# Patient Record
Sex: Female | Born: 2000 | Race: White | Hispanic: No | Marital: Single | Smoking: Never smoker
Health system: Southern US, Community
[De-identification: ages and names within clinical notes are randomized; demographics above are authoritative.]

## PROBLEM LIST (undated history)

## (undated) DIAGNOSIS — S6290XA Unspecified fracture of unspecified wrist and hand, initial encounter for closed fracture: Secondary | ICD-10-CM

## (undated) DIAGNOSIS — A770 Spotted fever due to Rickettsia rickettsii: Secondary | ICD-10-CM

## (undated) DIAGNOSIS — M5126 Other intervertebral disc displacement, lumbar region: Secondary | ICD-10-CM

## (undated) DIAGNOSIS — E119 Type 2 diabetes mellitus without complications: Secondary | ICD-10-CM

## (undated) HISTORY — DX: Other intervertebral disc displacement, lumbar region: M51.26

## (undated) HISTORY — DX: Unspecified fracture of unspecified wrist and hand, initial encounter for closed fracture: S62.90XA

## (undated) HISTORY — DX: Type 2 diabetes mellitus without complications: E11.9

## (undated) HISTORY — PX: TONSILLECTOMY: SUR1361

---
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
## 2021-04-14 ENCOUNTER — Encounter (HOSPITAL_COMMUNITY): Payer: Self-pay | Admitting: *Deleted

## 2021-04-14 ENCOUNTER — Other Ambulatory Visit: Payer: Self-pay

## 2021-04-14 ENCOUNTER — Emergency Department (HOSPITAL_COMMUNITY): Payer: BC Managed Care – PPO

## 2021-04-14 ENCOUNTER — Emergency Department (HOSPITAL_COMMUNITY)
Admission: EM | Admit: 2021-04-14 | Discharge: 2021-04-14 | Disposition: A | Discharge status: Home / Self Care | Payer: BC Managed Care – PPO | Attending: Emergency Medicine | Admitting: Emergency Medicine

## 2021-04-14 DIAGNOSIS — M25511 Pain in right shoulder: Secondary | ICD-10-CM | POA: Insufficient documentation

## 2021-04-14 DIAGNOSIS — S0083XA Contusion of other part of head, initial encounter: Secondary | ICD-10-CM | POA: Diagnosis not present

## 2021-04-14 DIAGNOSIS — S60212A Contusion of left wrist, initial encounter: Secondary | ICD-10-CM | POA: Insufficient documentation

## 2021-04-14 DIAGNOSIS — S4992XA Unspecified injury of left shoulder and upper arm, initial encounter: Secondary | ICD-10-CM | POA: Insufficient documentation

## 2021-04-14 DIAGNOSIS — S6992XA Unspecified injury of left wrist, hand and finger(s), initial encounter: Secondary | ICD-10-CM | POA: Diagnosis present

## 2021-04-14 DIAGNOSIS — T07XXXA Unspecified multiple injuries, initial encounter: Secondary | ICD-10-CM

## 2021-04-14 DIAGNOSIS — Y92410 Unspecified street and highway as the place of occurrence of the external cause: Secondary | ICD-10-CM | POA: Diagnosis not present

## 2021-04-14 HISTORY — DX: Spotted fever due to Rickettsia rickettsii: A77.0
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
## 2021-04-14 NOTE — Discharge Instructions (Addendum)
There is no indication that you have a fracture.  Use ibuprofen, 3 times a day as needed for pain.  Use ice on sore areas 3 times a day, for 2 days, after that use heat.

## 2021-10-01 ENCOUNTER — Ambulatory Visit (INDEPENDENT_AMBULATORY_CARE_PROVIDER_SITE_OTHER): Payer: BC Managed Care – PPO

## 2021-10-01 ENCOUNTER — Other Ambulatory Visit: Payer: Self-pay

## 2021-10-01 ENCOUNTER — Ambulatory Visit
Admission: EM | Admit: 2021-10-01 | Discharge: 2021-10-01 | Disposition: A | Discharge status: Home / Self Care | Payer: BC Managed Care – PPO | Attending: Internal Medicine | Admitting: Internal Medicine

## 2021-10-01 DIAGNOSIS — B9689 Other specified bacterial agents as the cause of diseases classified elsewhere: Secondary | ICD-10-CM

## 2021-10-01 DIAGNOSIS — R059 Cough, unspecified: Secondary | ICD-10-CM

## 2021-10-01 DIAGNOSIS — H65193 Other acute nonsuppurative otitis media, bilateral: Secondary | ICD-10-CM

## 2021-10-01 DIAGNOSIS — J069 Acute upper respiratory infection, unspecified: Secondary | ICD-10-CM

## 2021-10-01 DIAGNOSIS — H109 Unspecified conjunctivitis: Secondary | ICD-10-CM

## 2021-10-01 MED ORDER — BENZONATATE 100 MG PO CAPS: 100.0000 mg | ORAL_CAPSULE | Freq: Three times a day (TID) | ORAL | 0 refills | Status: DC | PRN

## 2021-10-01 MED ORDER — ERYTHROMYCIN 5 MG/GM OP OINT: TOPICAL_OINTMENT | OPHTHALMIC | 0 refills | Status: DC

## 2021-10-01 MED ORDER — AMOXICILLIN-POT CLAVULANATE 875-125 MG PO TABS: 1.0000 | ORAL_TABLET | Freq: Two times a day (BID) | ORAL | 0 refills | Status: DC

## 2021-10-01 NOTE — ED Triage Notes (Signed)
Patient presents to Urgent Care with complaints of bilateral eye drainage, bilateral ear pressure since 12/24. Cough x 2 weeks. Treating symptoms with mucinex, Nyquil, and robitussin with no improvement. Concerned with sinus infection. 3 negative covid tests. Has a hx of ear infections and sinus infections.    Denies fever.

## 2021-10-01 NOTE — ED Provider Notes (Signed)
[facility]    CSN: [id number] Arrival date & time: 10/01/21  2202      History   Chief Complaint Chief Complaint  Patient presents with   Otalgia   Itchy Eye   Sinus Problem    HPI Brittney Marsh is a 20 y.o. female.   Patient presents today for further evaluation of cough, bilateral eye drainage and irritation, bilateral ear pain, nasal congestion and runny nose.  Patient reports that productive cough with green sputum started approximately 2 weeks ago.  She developed upper respiratory symptoms and eye symptoms a few days ago.  Denies any known fevers or sick contacts at home.  Patient reports that she has eye redness and that she has woken up with her eyes "crusted shut".  Denies any blurry vision.  Patient does not wear contacts.  Denies any traumas or foreign bodies to the eye.  Patient has taken Mucinex, NyQuil, Robitussin with no improvement in symptoms.  Denies chest pain or shortness of breath.   Otalgia Sinus Problem   Past Medical History:  Diagnosis Date   Fracture of hand 10/06/2009   RIght 2nd MCP: Resolved   [place] spotted fever     Patient Active Problem List   Diagnosis Date Noted   Acute upper respiratory infection 11/25/2016   Cough 11/25/2016    Past Surgical History:  Procedure Laterality Date   TONSILLECTOMY     Kindergarten    OB History   No obstetric history on file.      Home Medications    Prior to Admission medications   Medication Sig Start Date End Date Taking? Authorizing Provider  amoxicillin-clavulanate (AUGMENTIN) 875-125 MG tablet Take 1 tablet by mouth every 12 (twelve) hours. 10/01/21  Yes Makaylin Carlo, Rolly Salter E, FNP  benzonatate (TESSALON) 100 MG capsule Take 1 capsule (100 mg total) by mouth every 8 (eight) hours as needed for cough. 10/01/21  Yes Deonta Bomberger, Rolly Salter E, FNP  erythromycin ophthalmic ointment Place a 1/2 inch ribbon of ointment into the lower eyelid 4 times daily for 7 days. 10/01/21  Yes Vanessa Alesi,  Rolly Salter E, FNP  acetaminophen-codeine (TYLENOL #3) 300-30 MG tablet Take 1-2 tablets by mouth at bedtime as needed. Patient not taking: Reported on 01/31/2016 10/10/15   Briscoe Deutscher, DO    Family History Family History  Problem Relation Age of Onset   Diabetes Father    Heart disease Paternal Grandfather     Social History Social History   Tobacco Use   Smoking status: Never   Smokeless tobacco: Never  Vaping Use   Vaping Use: Never used  Substance Use Topics   Alcohol use: Yes    Comment: occasional   Drug use: No     Allergies   Bee venom   Review of Systems Review of Systems Per HPI  Physical Exam Triage Vital Signs ED Triage Vitals  Enc Vitals Group     BP 10/01/21 1010 (!) 138/91     Pulse Rate 10/01/21 1010 100     Resp 10/01/21 1010 18     Temp 10/01/21 1010 97.7 F (36.5 C)     Temp Source 10/01/21 1010 Oral     SpO2 10/01/21 1010 98 %     Weight --      Height --      Head Circumference --      Peak Flow --      Pain Score 10/01/21 1013 6     Pain Loc --  Pain Edu? --      Excl. in GC? --    No data found.  Updated Vital Signs BP (!) 138/91 (BP Location: Left Arm)    Pulse 100    Temp 97.7 F (36.5 C) (Oral)    Resp 18    LMP 09/23/2021    SpO2 98%   Visual Acuity Right Eye Distance: 20/20 Left Eye Distance: 20/20 Bilateral Distance: 20/20  Right Eye Near:   Left Eye Near:    Bilateral Near:     Physical Exam Constitutional:      General: She is not in acute distress.    Appearance: Normal appearance. She is not toxic-appearing or diaphoretic.  HENT:     Head: Normocephalic and atraumatic.     Right Ear: Ear canal normal. No swelling or tenderness. No middle ear effusion. No mastoid tenderness. Tympanic membrane is erythematous. Tympanic membrane is not perforated or bulging.     Left Ear: Ear canal normal. No swelling or tenderness.  No middle ear effusion. No mastoid tenderness. Tympanic membrane is erythematous. Tympanic  membrane is not perforated or bulging.     Nose: Congestion and rhinorrhea present. Rhinorrhea is purulent.     Mouth/Throat:     Mouth: Mucous membranes are moist.     Pharynx: No posterior oropharyngeal erythema.  Eyes:     General: Lids are normal. Lids are everted, no foreign bodies appreciated. Vision grossly intact. Gaze aligned appropriately.     Extraocular Movements: Extraocular movements intact.     Conjunctiva/sclera:     Right eye: Right conjunctiva is injected. No chemosis, exudate or hemorrhage.    Left eye: Left conjunctiva is injected. No chemosis, exudate or hemorrhage.    Pupils: Pupils are equal, round, and reactive to light.  Cardiovascular:     Rate and Rhythm: Normal rate and regular rhythm.     Pulses: Normal pulses.     Heart sounds: Normal heart sounds.  Pulmonary:     Effort: Pulmonary effort is normal. No respiratory distress.     Breath sounds: Normal breath sounds. No wheezing.  Abdominal:     General: Abdomen is flat. Bowel sounds are normal.     Palpations: Abdomen is soft.  Musculoskeletal:        General: Normal range of motion.     Cervical back: Normal range of motion.  Skin:    General: Skin is warm and dry.  Neurological:     General: No focal deficit present.     Mental Status: She is alert and oriented to person, place, and time. Mental status is at baseline.  Psychiatric:        Mood and Affect: Mood normal.        Behavior: Behavior normal.     UC Treatments / Results  Labs (all labs ordered are listed, but only abnormal results are displayed) Labs Reviewed - No data to display  EKG   Radiology DG Chest 2 View  Result Date: 10/01/2021 CLINICAL DATA:  Persistent cough EXAM: CHEST - 2 VIEW COMPARISON:  None. FINDINGS: Normal heart size. Normal mediastinal contour. No pneumothorax. No pleural effusion. Lungs appear clear, with no acute consolidative airspace disease and no pulmonary edema. IMPRESSION: No active cardiopulmonary  disease. Electronically Signed   By: Delbert Phenix M.D.   On: 10/01/2021 11:30    Procedures Procedures (including critical care time)  Medications Ordered in UC Medications - No data to display  Initial Impression / Assessment and Plan /  UC Course  I have reviewed the triage vital signs and the nursing notes.  Pertinent labs & imaging results that were available during my care of the patient were reviewed by me and considered in my medical decision making (see chart for details).     Will treat acute upper respiratory infection and bilateral otitis media with augmentin antibiotic.  Will treat bilateral bacterial conjunctivitis with erythromycin ointment.  Visual acuity normal today in urgent care.  Chest x-ray was negative for any acute cardiopulmonary process.  Benzonatate prescribed take as needed for cough.  No red flags on exam.  Discussed strict return cautions.  Patient verbalized understanding and was agreeable with plan. Final Clinical Impressions(s) / UC Diagnoses   Final diagnoses:  Bacterial conjunctivitis of both eyes  Other non-recurrent acute nonsuppurative otitis media of both ears  Acute upper respiratory infection     Discharge Instructions      You have been prescribed an antibiotic pill and an antibiotic ointment to treat your symptoms.  You have also been prescribed a cough medication.  Your chest x-ray was normal.     ED Prescriptions     Medication Sig Dispense Auth. Provider   erythromycin ophthalmic ointment Place a 1/2 inch ribbon of ointment into the lower eyelid 4 times daily for 7 days. 3.5 g Ervin Knack E, [place]   amoxicillin-clavulanate (AUGMENTIN) 875-125 MG tablet Take 1 tablet by mouth every 12 (twelve) hours. 14 tablet [place], Ophiem E, [place]   benzonatate (TESSALON) 100 MG capsule Take 1 capsule (100 mg total) by mouth every 8 (eight) hours as needed for cough. 21 capsule [place], Acie Fredrickson, [place]      PDMP not reviewed this encounter.   Gustavus Bryant, [place] 10/01/21 1157

## 2021-10-01 NOTE — Discharge Instructions (Signed)
You have been prescribed an antibiotic pill and an antibiotic ointment to treat your symptoms.  You have also been prescribed a cough medication.  Your chest x-ray was normal.

## 2022-05-15 ENCOUNTER — Other Ambulatory Visit (HOSPITAL_COMMUNITY)
Admission: RE | Admit: 2022-05-15 | Discharge: 2022-05-15 | Disposition: A | Discharge status: Home / Self Care | Payer: BC Managed Care – PPO | Source: Ambulatory Visit | Attending: Advanced Practice Midwife | Admitting: Advanced Practice Midwife

## 2022-05-15 ENCOUNTER — Ambulatory Visit (INDEPENDENT_AMBULATORY_CARE_PROVIDER_SITE_OTHER): Payer: BC Managed Care – PPO | Admitting: Advanced Practice Midwife

## 2022-05-15 ENCOUNTER — Encounter: Payer: Self-pay | Admitting: Advanced Practice Midwife

## 2022-05-15 VITALS — BP 134/86 | HR 93 | Ht 61.0 in | Wt 216.0 lb

## 2022-05-15 DIAGNOSIS — Z6841 Body Mass Index (BMI) 40.0 and over, adult: Secondary | ICD-10-CM

## 2022-05-15 DIAGNOSIS — Z01419 Encounter for gynecological examination (general) (routine) without abnormal findings: Secondary | ICD-10-CM | POA: Diagnosis not present

## 2022-05-15 DIAGNOSIS — E6609 Other obesity due to excess calories: Secondary | ICD-10-CM

## 2022-05-15 DIAGNOSIS — Z30011 Encounter for initial prescription of contraceptive pills: Secondary | ICD-10-CM | POA: Diagnosis not present

## 2022-05-15 LAB — CBC
Hematocrit: 42 % (ref 34.0–46.6)
Hemoglobin: 13.9 g/dL (ref 11.1–15.9)
MCH: 29.5 pg (ref 26.6–33.0)
MCHC: 33.1 g/dL (ref 31.5–35.7)
MCV: 89 fL (ref 79–97)
Platelets: 313 10*3/uL (ref 150–450)
RBC: 4.71 x10E6/uL (ref 3.77–5.28)
RDW: 12.7 % (ref 11.7–15.4)
WBC: 8.4 10*3/uL (ref 3.4–10.8)

## 2022-05-15 LAB — HEMOGLOBIN A1C
Est. average glucose Bld gHb Est-mCnc: 137 mg/dL
Hgb A1c MFr Bld: 6.4 % — ABNORMAL HIGH (ref 4.8–5.6)

## 2022-05-15 MED ORDER — SLYND 4 MG PO TABS: 1.0000 | ORAL_TABLET | Freq: Every day | ORAL | 3 refills | Status: AC

## 2022-05-15 NOTE — Progress Notes (Signed)
Would like to get back on OCP's

## 2022-05-15 NOTE — Progress Notes (Signed)
GYNECOLOGY ANNUAL PREVENTATIVE CARE ENCOUNTER NOTE  History:     Brittney Marsh is a 21 y.o. G0P0000 female here for a routine annual gynecologic exam.  Current complaints: patient would like to restart contraception, desires pregnancy in 12-24 months. Previous negative experience with Brittney Marsh which caused significant weight gain. Denies abnormal vaginal bleeding, discharge, pelvic pain, problems with intercourse or other gynecologic concerns.   Patient works as Pharmacist, hospital and travel Music therapist. Engaged! Honeymoon in [place]. They have two dogs, a poodle and a cockapoo.    Gynecologic History Patient's last menstrual period was 05/11/2022 (exact date). Contraception: condoms Last Pap: No pap history, first pap today :) Last Mammogram: N/A.   Last Colonoscopy: N/A.   Obstetric History OB History  Gravida Para Term Preterm AB Living  0 0 0 0 0 0  SAB IAB Ectopic Multiple Live Births  0 0 0 0 0    Past Medical History:  Diagnosis Date   Fracture of hand 10/06/2009   RIght 2nd MCP: Resolved   Rocky Brittney spotted fever     Past Surgical History:  Procedure Laterality Date   TONSILLECTOMY     Kindergarten    No current outpatient medications on file prior to visit.   No current facility-administered medications on file prior to visit.    Allergies  Allergen Reactions   Bee Venom Swelling    Whole body swelling    Social History:  reports that she has never smoked. She has never used smokeless tobacco. She reports current alcohol use. She reports that she does not use drugs.  Family History  Problem Relation Age of Onset   Diabetes Father    Heart disease Paternal Grandfather     The following portions of the patient's history were reviewed and updated as appropriate: allergies, current medications, past family history, past medical history, past social history, past surgical history and problem list.  Review of Systems Pertinent items noted in HPI and remainder of  comprehensive ROS otherwise negative.  Physical Exam:  BP 134/86   Pulse 93   Ht _0  (1.549 m)   Wt 216 lb (98 kg)   LMP 05/11/2022 (Exact Date)   BMI 40.81 kg/m  CONSTITUTIONAL: Well-developed, well-nourished female in no acute distress.  HENT:  Normocephalic, atraumatic, External right and left ear normal.  EYES: Conjunctivae and EOM are normal. Pupils are equal, round, and reactive to light. No scleral icterus.  NECK: Normal range of motion, supple, no masses.  Normal thyroid.  SKIN: Skin is warm and dry. No rash noted. Not diaphoretic. No erythema. No pallor. MUSCULOSKELETAL: Normal range of motion. No tenderness.  No cyanosis, clubbing, or edema. NEUROLOGIC: Alert and oriented to person, place, and time. Normal reflexes, muscle tone coordination.  PSYCHIATRIC: Normal mood and affect. Normal behavior. Normal judgment and thought content. CARDIOVASCULAR: Normal heart rate noted, regular rhythm RESPIRATORY: Clear to auscultation bilaterally. Effort and breath sounds normal, no problems with respiration noted. BREASTS: Symmetric in size. No masses, tenderness, skin changes, nipple drainage, or lymphadenopathy bilaterally. Performed in the presence of a chaperone. ABDOMEN: Soft, no distention noted.  No tenderness, rebound or guarding.  PELVIC: Normal appearing external genitalia and urethral meatus; normal appearing vaginal mucosa and cervix.  No abnormal vaginal discharge noted.  Pap smear obtained.  Performed in the presence of a chaperone.   Assessment and Plan:    1. Well woman exam with routine gynecological exam - Welcome to practice, thank you for your trust! - Did  so well with her first pap smear today! - Cytology - PAP  2. Obesity due to excess calories without serious comorbidity, unspecified classification - Doing awesome with physical exercise, Burn [facility] - Has PCP but hoping to find female provider in same practice - CBC - Comp Met (CMET) - Hemoglobin A1c -  Thyroid Panel With TSH - Lipid panel - Referral to Nutrition and Diabetes Services  3. Oral contraception initiation - Reviewed typical learning curve for POPs, Slynd most cost effective via mail order pharmacy - Continue condom use while relearning daily pill regimen  - Drospirenone (SLYND) 4 MG TABS; Take 1 each by mouth daily.  Dispense: 28 tablet; Refill: 3  Will follow up results of pap smear and manage accordingly. Routine preventative health maintenance measures emphasized. Please refer to After Visit Summary for other counseling recommendations.      RTC in 3 months to review patient satisfaction with Brittney Marsh, Brittney View, MSN, CNM Certified Nurse Midwife, Brittney Marsh for Brittney Marsh, Brittney Marsh

## 2022-05-16 LAB — COMPREHENSIVE METABOLIC PANEL
ALT: 24 IU/L (ref 0–32)
AST: 21 IU/L (ref 0–40)
Albumin/Globulin Ratio: 1.9 (ref 1.2–2.2)
Albumin: 4.3 g/dL (ref 4.0–5.0)
Alkaline Phosphatase: 74 IU/L (ref 44–121)
BUN/Creatinine Ratio: 21 (ref 9–23)
BUN: 15 mg/dL (ref 6–20)
Bilirubin Total: 0.6 mg/dL (ref 0.0–1.2)
CO2: 21 mmol/L (ref 20–29)
Calcium: 9.5 mg/dL (ref 8.7–10.2)
Chloride: 102 mmol/L (ref 96–106)
Creatinine, Ser: 0.7 mg/dL (ref 0.57–1.00)
Globulin, Total: 2.3 g/dL (ref 1.5–4.5)
Glucose: 121 mg/dL — ABNORMAL HIGH (ref 70–99)
Potassium: 4.1 mmol/L (ref 3.5–5.2)
Sodium: 139 mmol/L (ref 134–144)
Total Protein: 6.6 g/dL (ref 6.0–8.5)
eGFR: 125 mL/min/{1.73_m2} (ref >59)

## 2022-05-16 LAB — THYROID PANEL WITH TSH
Free Thyroxine Index: 2.3 (ref 1.2–4.9)
T3 Uptake Ratio: 25 % (ref 24–39)
T4, Total: 9.3 ug/dL (ref 4.5–12.0)
TSH: 2.89 u[IU]/mL (ref 0.450–4.500)

## 2022-05-16 LAB — LIPID PANEL
Chol/HDL Ratio: 5.3 ratio — ABNORMAL HIGH (ref 0.0–4.4)
Cholesterol, Total: 234 mg/dL — ABNORMAL HIGH (ref 100–199)
HDL: 44 mg/dL (ref >39)
LDL Chol Calc (NIH): 141 mg/dL — ABNORMAL HIGH (ref 0–99)
Triglycerides: 272 mg/dL — ABNORMAL HIGH (ref 0–149)
VLDL Cholesterol Cal: 49 mg/dL — ABNORMAL HIGH (ref 5–40)

## 2022-05-30 LAB — CYTOLOGY - PAP
Chlamydia: NEGATIVE
Comment: NEGATIVE
Comment: NORMAL
Diagnosis: NEGATIVE
Neisseria Gonorrhea: NEGATIVE

## 2022-07-16 ENCOUNTER — Encounter: Payer: BC Managed Care – PPO | Attending: Advanced Practice Midwife | Admitting: Dietician

## 2022-07-16 ENCOUNTER — Encounter: Payer: Self-pay | Admitting: Dietician

## 2022-07-16 DIAGNOSIS — Z6841 Body Mass Index (BMI) 40.0 and over, adult: Secondary | ICD-10-CM | POA: Diagnosis not present

## 2022-07-16 NOTE — Progress Notes (Signed)
Medical Nutrition Therapy: Visit start time: [id number]  end time: 1645  Assessment:   Referral Diagnosis: obesity Other medical history/ diagnoses: none significant Psychosocial issues/ stress concerns: none  Medications, supplements: reconciled list in medical record   Preferred learning method:  Auditory Visual Hands-on    Current weight: 216.9lbs Height: 5'1" BMI: 40.98 Patient's personal weight goal: 160lbs  Progress and evaluation:  Patient reports she lost about 50lbs on Optivia 2 years ago, but has regained all of the weight. She was buying Optivia from a close friend who then passed away.  Has tried multiple diets with only limited and short term success. Used MyFitnessPal for a time and was following healthy eating pattern but still experienced weight fluctuation  She reports both parents have diabetes, and she has had increase in DM related labs.  Recent HbA1C was 6.4%; total cholesterol 234, HDL 44, LDL 141, Triglycerides 272 (05/15/22) Food allergies: no allergies; reports lactose intolerance, but is able to have small amounts without issue Special diet practices: none Patient seeks help with sustainable weight loss and improved health risk    Dietary Intake:  Usual eating pattern includes 3 meals and 1-2 snacks per day. Dining out frequency: 5-7 meals per week. Who plans meals/ buys groceries? self Who prepares meals? 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, 2-3 times a week currently due to wedding planning; usually 3-5 itmes a week   Intervention:   Nutrition Care Education:    Basic nutrition: basic food groups; appropriate nutrient balance; appropriate meal and snack schedule; general nutrition guidelines    Weight control: importance of low sugar and low fat choices; portion control; estimated energy needs for weight loss at 1600 kcal, provided guidance for 45% CHO, 25% pro, 30% fat; role of stress; role of physical activity; tracking food intake -- uses and options Advanced nutrition:  cooking techniques -- meal planning and pre-prepping, planned leftovers; dining out Diabetes prevention:  goal for HbA1C; appropriate meal and snack schedule; appropriate carb intake and balance, healthy carb choices; role of fiber, protein, fat; physical activity; effects of stress Hyperlipidemia:  target goals for lipids; healthy and unhealthy fats; role of fiber, plant sterols; role of exercise  Other intervention notes: Patient has been working on healthy diet habits for some time; she is following a low carb, high protein pattern.  Upcoming wedding might make lifestyle changes more challenging for a time, encouraged patient to make gradual changes, and to focus on 1-2 changes at a time. Established nutrition goals with direction from patient She will plan to schedule MNT follow when able after wedding/ honeymoon.   Nutritional Diagnosis:  [place]-2.2 Altered nutrition-related laboratory As related to diabetes risk and heart health.  As evidenced by elevated HbA1c, elevated total and LDL cholesterol, elevated triglycerides. [place]-3.3 Overweight/obesity As related to excess calories, dieting history.  As evidenced by patient with current BMI of 40.98.   Education Materials given:  General diet guidelines for Diabetes (prevention) Plate Planner with food lists, sample meal pattern Sample menus Visit summary with goals/ instructions   Learner/ who was taught:  Patient   Level of understanding: Verbalizes/  demonstrates competency  Demonstrated degree of understanding via:   Teach  back Learning barriers: None  Willingness to learn/ readiness for change: Eager, change in progress  Monitoring and Evaluation:  Dietary intake, exercise, BG control, blood lipids, and body weight      follow up: patient to schedule later for Dec 2023 or Jan 2024

## 2022-07-16 NOTE — Patient Instructions (Signed)
Use MyFitnessPal or other tracking app to track "normal" food intake for several days, then add 100-150 calories, perhaps as 1-2 servings of healthy carbs, with 1-2 meals during the day.  Options for adding healthy carbs might be a fruit or whole grain bread/ breakfast bar with protein shake; 1/2 cup potatoes, or 1-2 slices whole grain bread, or 1/3 - 2/3 cup brown rice or pasta, 5-6 small whole grain/ seeded crackers or crispbread. Include generous portions of veggies, add some into rice/ pasta/ potatoes/ meats to stretch portions, and plan to have a veggie and/or fruit with most meals.

## 2022-08-10 ENCOUNTER — Encounter: Payer: Self-pay | Admitting: Advanced Practice Midwife

## 2022-08-11 ENCOUNTER — Other Ambulatory Visit: Payer: Self-pay | Admitting: *Deleted

## 2022-08-11 MED ORDER — SLYND 4 MG PO TABS: 4.0000 mg | ORAL_TABLET | Freq: Every day | ORAL | 9 refills | Status: DC

## 2022-09-03 IMAGING — DX DG CHEST 2V
2 series · 2 of 2 positions shown · non-contrast
Comparison: None.

CLINICAL DATA: Persistent cough

EXAM:
CHEST - 2 VIEW

[chest pa]
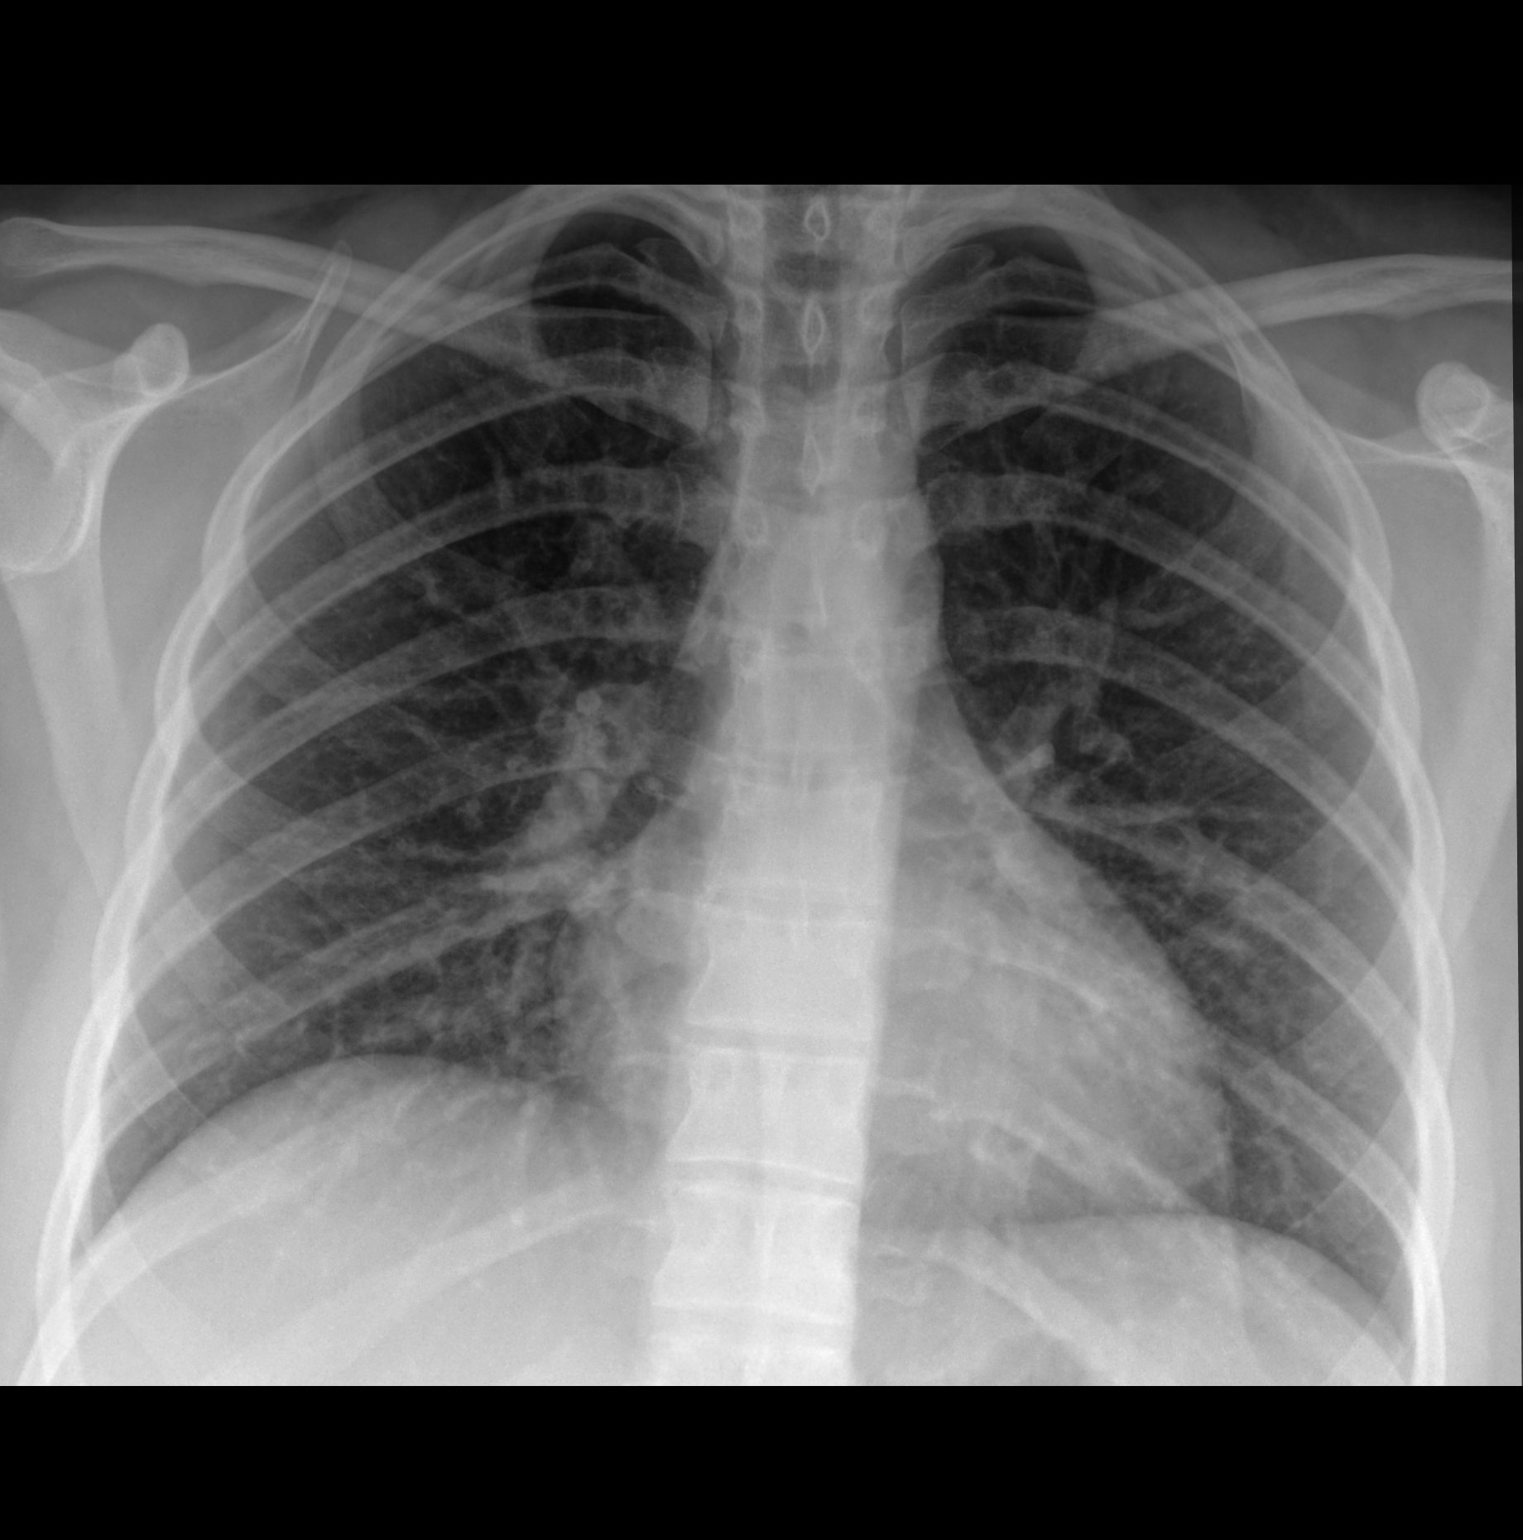

[chest lat]
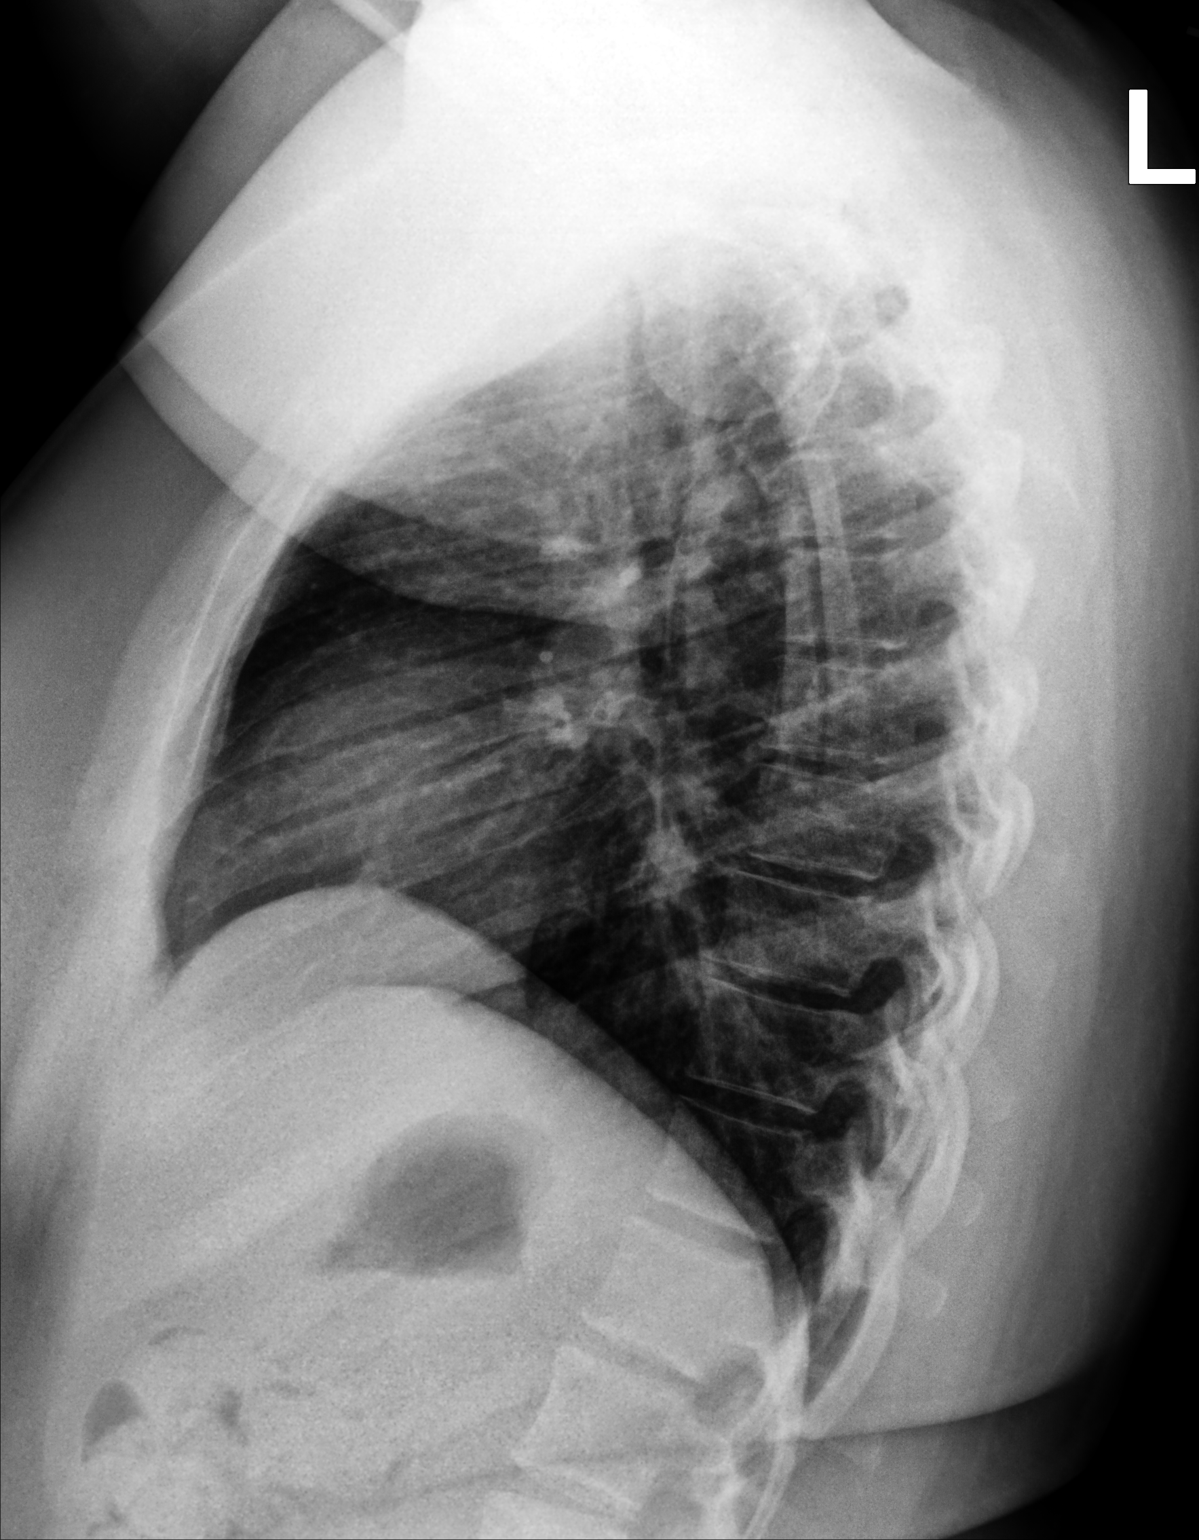

[2 of 2 positions shown; findings below may reference images not displayed]

FINDINGS: Normal heart size. Normal mediastinal contour. No pneumothorax. No
pleural effusion. Lungs appear clear, with no acute consolidative
airspace disease and no pulmonary edema.
IMPRESSION: No active cardiopulmonary disease.

## 2023-05-18 ENCOUNTER — Encounter: Payer: Self-pay | Admitting: Family Medicine

## 2023-05-18 ENCOUNTER — Ambulatory Visit (INDEPENDENT_AMBULATORY_CARE_PROVIDER_SITE_OTHER): Payer: BC Managed Care – PPO | Admitting: Family Medicine

## 2023-05-18 VITALS — BP 124/86 | HR 94 | Ht 61.0 in | Wt 225.0 lb

## 2023-05-18 DIAGNOSIS — Z1339 Encounter for screening examination for other mental health and behavioral disorders: Secondary | ICD-10-CM | POA: Diagnosis not present

## 2023-05-18 DIAGNOSIS — L680 Hirsutism: Secondary | ICD-10-CM | POA: Diagnosis not present

## 2023-05-18 DIAGNOSIS — Z01411 Encounter for gynecological examination (general) (routine) with abnormal findings: Secondary | ICD-10-CM

## 2023-05-18 DIAGNOSIS — R6889 Other general symptoms and signs: Secondary | ICD-10-CM | POA: Diagnosis not present

## 2023-05-18 DIAGNOSIS — R635 Abnormal weight gain: Secondary | ICD-10-CM

## 2023-05-18 DIAGNOSIS — F419 Anxiety disorder, unspecified: Secondary | ICD-10-CM | POA: Insufficient documentation

## 2023-05-18 NOTE — Assessment & Plan Note (Signed)
Discussed medication and treatment options. Discussed her anxiety. ? Early PCOS--discussed at length. Having regular cycles and discussed timing of intercourse and folic acid.

## 2023-05-18 NOTE — Progress Notes (Signed)
Patient presents for Annual.  Breast Exam only today no Gyn concerns.  LMP: 05/10/2023 monthly ,notes bad cramps and mood swings no longer taking Slynd. Not ready to start having children just yet. Married 07/2022. Last pap: Date: 05/15/2022 Contraception: None Mammogram: Not yet indicated STD Screening: Declines Flu Vaccine : N/A  CC:  pt has concerns about PCOS  notes losing hair and facial hair, fatigue, insomnia , hot flashes struggling to lose weight  no matter what methods she tries.  Fun Fact: Married 10/23. Loves to travel. Recently went to [place] for 2 wks.

## 2023-05-18 NOTE — Assessment & Plan Note (Signed)
Discussed medication, meditation, mindfulness, yoga, self care.

## 2023-05-18 NOTE — Patient Instructions (Signed)

## 2023-05-18 NOTE — Progress Notes (Signed)
Subjective:     Brittney Marsh is a 22 y.o. female and is here for a comprehensive physical exam. The patient reports problems - weight gain .Has ? PCOS. Reports significant weight gain of almost 50#. Inability to lose weight. She is growing dark hair under her chin. Losing her hair. Feels hot all the time. Poor sleep.  Cycles are regular and last 4-6 days. Has some heavy cycles. Was taking Slynd, but had mood swings and did not feel like herself. She has high anxiety. She has 2 jobs. 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     The following portions of the patient's history were reviewed and updated as appropriate: allergies, current medications, past family history, past medical history, past social history, past surgical history, and problem list.  Review of Systems Pertinent items noted in HPI and remainder of comprehensive ROS otherwise negative.   Objective:    BP 124/86   Pulse 94   Ht 5\' 1"  (1.549 m)   Wt 225 lb (102.1 kg)   LMP 05/10/2023 (Exact Date)   BMI 42.51 kg/m  General appearance: alert, cooperative, and appears stated age Head: Normocephalic, without obvious abnormality, atraumatic Neck: no adenopathy, supple, symmetrical, trachea midline, and thyroid not enlarged, symmetric, no tenderness/mass/nodules Lungs: clear to auscultation bilaterally Breasts: normal appearance, no masses or tenderness Heart: regular rate and rhythm Abdomen: soft, non-tender; bowel sounds normal; no masses,  no organomegaly Extremities: extremities normal, atraumatic, no cyanosis or edema Pulses: 2+ and symmetric Skin: Skin color, texture, turgor normal. No rashes or lesions Lymph nodes: Cervical, supraclavicular, and axillary nodes normal. Neurologic: Grossly normal    Assessment:    Healthy female exam.      Plan:   Problem List Items Addressed This Visit       Unprioritized   Hirsutism    Discussed option of spironolactone      Relevant Orders   TSH    TestT+TestF+SHBG   Weight gain    Discussed medication and treatment options. Discussed her anxiety. ? Early PCOS--discussed at length. Having regular cycles and discussed timing of intercourse and folic acid.      Relevant Orders   TSH   Follicle stimulating hormone   LH   Anxiety    Discussed medication, meditation, mindfulness, yoga, self care.      Other Visit Diagnoses     Encounter for gynecological examination with abnormal finding    -  Primary   Relevant Orders   CBC   TSH   Hemoglobin A1c   Comprehensive metabolic panel   Lipid panel   Heat intolerance       Relevant Orders   TSH   TestT+TestF+SHBG   Follicle stimulating hormone   LH      Return in 1 year (on 05/17/2024).    See After Visit Summary for Counseling Recommendations

## 2023-05-18 NOTE — Assessment & Plan Note (Signed)
Discussed option of spironolactone

## 2023-05-22 LAB — COMPREHENSIVE METABOLIC PANEL
Albumin: 4.7 g/dL (ref 4.0–5.0)
Chloride: 99 mmol/L (ref 96–106)

## 2023-05-22 LAB — CBC: RBC: 5.1 x10E6/uL (ref 3.77–5.28)

## 2023-05-30 LAB — 17-HYDROXYPROGESTERONE: 17-OH Progesterone LCMS: 21 ng/dL

## 2023-05-30 LAB — SPECIMEN STATUS REPORT

## 2023-06-01 ENCOUNTER — Other Ambulatory Visit: Payer: Self-pay | Admitting: *Deleted

## 2023-06-01 ENCOUNTER — Encounter: Payer: Self-pay | Admitting: Family Medicine

## 2023-06-01 DIAGNOSIS — E1169 Type 2 diabetes mellitus with other specified complication: Secondary | ICD-10-CM

## 2023-06-01 DIAGNOSIS — E7800 Pure hypercholesterolemia, unspecified: Secondary | ICD-10-CM | POA: Insufficient documentation

## 2023-06-01 DIAGNOSIS — E119 Type 2 diabetes mellitus without complications: Secondary | ICD-10-CM | POA: Insufficient documentation

## 2023-06-12 ENCOUNTER — Encounter: Payer: Self-pay | Admitting: Family Medicine

## 2023-07-01 ENCOUNTER — Ambulatory Visit: Payer: BC Managed Care – PPO | Admitting: Family Medicine

## 2023-07-01 ENCOUNTER — Other Ambulatory Visit: Payer: Self-pay | Admitting: Family Medicine

## 2023-07-01 ENCOUNTER — Encounter: Payer: Self-pay | Admitting: Family Medicine

## 2023-07-01 VITALS — BP 136/90 | HR 87 | Wt 227.0 lb

## 2023-07-01 DIAGNOSIS — R635 Abnormal weight gain: Secondary | ICD-10-CM | POA: Diagnosis not present

## 2023-07-01 DIAGNOSIS — Z30011 Encounter for initial prescription of contraceptive pills: Secondary | ICD-10-CM | POA: Diagnosis not present

## 2023-07-01 DIAGNOSIS — E7800 Pure hypercholesterolemia, unspecified: Secondary | ICD-10-CM

## 2023-07-01 DIAGNOSIS — E119 Type 2 diabetes mellitus without complications: Secondary | ICD-10-CM | POA: Diagnosis not present

## 2023-07-01 DIAGNOSIS — L680 Hirsutism: Secondary | ICD-10-CM

## 2023-07-01 DIAGNOSIS — Z7985 Long-term (current) use of injectable non-insulin antidiabetic drugs: Secondary | ICD-10-CM

## 2023-07-01 MED ORDER — SEMAGLUTIDE (1 MG/DOSE) 4 MG/3ML ~~LOC~~ SOPN
0.7500 mg | PEN_INJECTOR | SUBCUTANEOUS | 0 refills | Status: DC
Start: 2023-07-01 — End: 2023-09-16

## 2023-07-01 MED ORDER — SEMAGLUTIDE (1 MG/DOSE) 4 MG/3ML ~~LOC~~ SOPN
0.2500 mg | PEN_INJECTOR | SUBCUTANEOUS | 0 refills | Status: DC
Start: 2023-07-01 — End: 2023-07-01

## 2023-07-01 MED ORDER — DROSPIRENONE-ETHINYL ESTRADIOL 3-0.02 MG PO TABS: 1.0000 | ORAL_TABLET | Freq: Every day | ORAL | 3 refills | Status: DC

## 2023-07-01 MED ORDER — SEMAGLUTIDE (1 MG/DOSE) 4 MG/3ML ~~LOC~~ SOPN
0.5000 mg | PEN_INJECTOR | SUBCUTANEOUS | 0 refills | Status: DC
Start: 2023-07-01 — End: 2023-09-16

## 2023-07-01 NOTE — Assessment & Plan Note (Signed)
?   PCOS Ozempic is likely to help with this as well.

## 2023-07-01 NOTE — Progress Notes (Signed)
RGYN here to discuss recent labs/PCOS.

## 2023-07-01 NOTE — Assessment & Plan Note (Signed)
Trial of Yaz, since she should not be pregnant with this medication. May help her hirsutism as well.

## 2023-07-01 NOTE — Assessment & Plan Note (Signed)
Begin Ozempic Usual side effects reviewed.

## 2023-07-01 NOTE — Progress Notes (Addendum)
   Subjective:    Patient ID: Brittney Marsh is a 22 y.o. female presenting with Follow-up  on 07/01/2023  HPI: Here to discuss lab results. Appears to have possible NASH, hypercholesterolemia, weight gain hirsutism, and T2DM. She has tried and failed Slynd.  She has an elevated testosterone c/w early PCOS, though her cycle remains normal. Other labs are normal.  Review of Systems  Constitutional:  Negative for chills and fever.  Respiratory:  Negative for shortness of breath.   Cardiovascular:  Negative for chest pain.  Gastrointestinal:  Negative for abdominal pain, nausea and vomiting.  Genitourinary:  Negative for dysuria.  Skin:  Negative for rash.      Objective:    BP (!) 136/90   Pulse 87   Wt 227 lb (103 kg)   LMP 06/28/2023 (Exact Date)   BMI 42.89 kg/m  Physical Exam Exam conducted with a chaperone present.  Constitutional:      General: She is not in acute distress.    Appearance: She is well-developed.  HENT:     Head: Normocephalic and atraumatic.  Eyes:     General: No scleral icterus. Cardiovascular:     Rate and Rhythm: Normal rate.  Pulmonary:     Effort: Pulmonary effort is normal.  Abdominal:     Palpations: Abdomen is soft.  Musculoskeletal:     Cervical back: Neck supple.  Skin:    General: Skin is warm and dry.  Neurological:     Mental Status: She is alert and oriented to person, place, and time.         Assessment & Plan:   Problem List Items Addressed This Visit       Unprioritized   Hirsutism    Trial of Yaz, since she should not be pregnant with this medication. May help her hirsutism as well.      Weight gain    ? PCOS Ozempic is likely to help with this as well.      Type 2 diabetes mellitus (HCC) - Primary    Begin Ozempic Usual side effects reviewed.      Relevant Medications   Semaglutide, 1 MG/DOSE, 4 MG/3ML SOPN   Semaglutide, 1 MG/DOSE, 4 MG/3ML SOPN   Semaglutide, 1 MG/DOSE, 4 MG/3ML SOPN    Hypercholesteremia    Trial of diet and exercise. May need statin, also, no ok with pregnancy.      Other Visit Diagnoses     Initiation of oral contraception       Relevant Medications   drospirenone-ethinyl estradiol (YAZ) 3-0.02 MG tablet       Return in about 3 months (around 09/30/2023) for weight check and possible increase in ozempic.Reva Bores, MD 07/01/2023 4:33 PM

## 2023-07-01 NOTE — Assessment & Plan Note (Signed)
Trial of diet and exercise. May need statin, also, no ok with pregnancy.

## 2023-07-05 ENCOUNTER — Encounter: Payer: Self-pay | Admitting: Family Medicine

## 2023-08-04 ENCOUNTER — Encounter: Payer: Self-pay | Admitting: *Deleted

## 2023-08-17 ENCOUNTER — Other Ambulatory Visit: Payer: Self-pay | Admitting: Family Medicine

## 2023-08-17 DIAGNOSIS — E119 Type 2 diabetes mellitus without complications: Secondary | ICD-10-CM

## 2023-08-17 MED ORDER — OZEMPIC (1 MG/DOSE) 4 MG/3ML ~~LOC~~ SOPN: 0.2500 mg | PEN_INJECTOR | SUBCUTANEOUS | 0 refills | Status: DC

## 2023-09-15 ENCOUNTER — Telehealth: Payer: Self-pay | Admitting: *Deleted

## 2023-09-15 NOTE — Telephone Encounter (Signed)
Received a phone message from [facility] stating they keep getting Prescriptions for Ozempic 1mg  dose pen but directions say to inject 0.25 once a week. They state that pen is only able to do 1 mg dose. They want to know if they need to change prescription to 0.25 mg pen. They also state she has gotten several of the 1 mg pens. They request a call back.  Per chart review this is a [facility] patient , I will route to that office. Nancy Fetter

## 2023-09-16 ENCOUNTER — Other Ambulatory Visit: Payer: Self-pay | Admitting: Family Medicine

## 2023-09-16 MED ORDER — SEMAGLUTIDE-WEIGHT MANAGEMENT 0.25 MG/0.5ML ~~LOC~~ SOAJ: 0.2500 mg | SUBCUTANEOUS | 3 refills | Status: DC

## 2023-09-28 ENCOUNTER — Ambulatory Visit: Payer: Self-pay | Admitting: Family

## 2023-10-20 ENCOUNTER — Other Ambulatory Visit: Payer: Self-pay | Admitting: *Deleted

## 2023-10-20 MED ORDER — SEMAGLUTIDE (1 MG/DOSE) 4 MG/3ML ~~LOC~~ SOPN: 1.0000 mg | PEN_INJECTOR | SUBCUTANEOUS | 1 refills | Status: DC

## 2023-10-21 ENCOUNTER — Telehealth: Payer: Self-pay | Admitting: *Deleted

## 2023-10-21 NOTE — Telephone Encounter (Signed)
 Called [facility] to cancel the Ozempic  0.25mg  as pt only needs the 1mg  dose

## 2024-01-03 ENCOUNTER — Other Ambulatory Visit: Payer: Self-pay | Admitting: Family Medicine

## 2024-01-11 ENCOUNTER — Encounter: Payer: Self-pay | Admitting: Family Medicine

## 2024-01-14 NOTE — Progress Notes (Signed)
 Brittney Marsh is a 23 y.o. female in clinic today for evaluation of allergies.  HPI: History of Present Illness Brittney Marsh is a 23 year old female who presents with worsening allergy symptoms.  She has been experiencing worsening allergy symptoms, which she suspects may have developed into a sinus infection. Her symptoms include a sore throat when swallowing, discolored mucus when sneezing and blowing her nose, clogged ears, and itchiness and irritation. These symptoms have persisted since the beginning of the allergy season in March and were exacerbated after attending an outdoor craft fair [place] a farm over last weekend, approximately 5 days ago.  She typically manages her symptoms with Claritin, which has been effective until recently. She has tried Zyrtec without relief and has not been using any other daily allergy medications like Flonase. She attempted to self-treat with a single dose of amoxicillin , suspecting strep throat, but notes that she usually recognizes strep due to her history of tonsillectomy and adenoidectomy. She has previously experienced severe allergies, including a ruptured eardrum from sneezing.  She is preparing for a cruise and is concerned about managing her symptoms during the trip. She is a travel agent and a first-grade Runner, broadcasting/film/video, and has not taken a day off this year. No fever reported.   ROS: Review of Systems  Constitutional:  Negative for chills, fever and malaise/fatigue.  HENT:  Positive for congestion and sore throat. Negative for ear pain and sinus pain.   Respiratory:  Negative for cough, shortness of breath and wheezing.   Cardiovascular:  Negative for chest pain.  Gastrointestinal:  Negative for diarrhea, nausea and vomiting.  Musculoskeletal:  Negative for myalgias.  Skin:  Positive for itching. Negative for rash.  Neurological:  Negative for headaches.     Allergies  Allergen Reactions  . Bee Sting Kit Swelling    Past Medical History:   Diagnosis Date  . Allergic state       BP (!) 140/90   Pulse 101   Temp 36.5 C (97.7 F)   Ht 154.9 cm (5' 1)   Wt 93.9 kg (207 lb)   SpO2 98%   BMI 39.11 kg/m    Physical Exam Constitutional:      General: She is not in acute distress.    Appearance: Normal appearance.  HENT:     Head: Normocephalic and atraumatic.     Right Ear: Ear canal and external ear normal. A middle ear effusion is present.     Left Ear: Ear canal and external ear normal. A middle ear effusion is present.     Nose: Congestion present.     Right Nostril: No occlusion.     Left Nostril: No occlusion.     Right Sinus: No maxillary sinus tenderness or frontal sinus tenderness.     Left Sinus: No maxillary sinus tenderness or frontal sinus tenderness.     Mouth/Throat:     Dentition: Normal dentition.     Pharynx: Oropharynx is clear. Uvula midline.     Tonsils: No tonsillar exudate or tonsillar abscesses.  Pulmonary:     Effort: Pulmonary effort is normal.  Musculoskeletal:     Cervical back: Full passive range of motion without pain.  Lymphadenopathy:     Cervical: No cervical adenopathy.  Neurological:     General: No focal deficit present.     Mental Status: She is alert and oriented to person, place, and time.  Psychiatric:        Mood and Affect: Mood and affect  normal.        Speech: Speech normal.        Thought Content: Thought content normal.      Plan: Assessment & Plan Allergic Rhinitis Symptoms consistent with allergic rhinitis, exacerbated by outdoor exposure. Fluid behind eardrum suggests eustachian tube dysfunction. No bacterial infection noted at this time. Limited relief from Claritin and Zyrtec. - Prescribed prednisone to reduce inflammation. - Initiated Flonase nasal spray for eustachian tube dysfunction. - Increased Claritin to twice daily for 1-2 weeks. - Recommended allergist consultation if symptoms persist.  Sinusitis Symptoms suggest sinusitis but align more  with allergic rhinitis. No evidence of bacterial infection. - Provided Augmentin  prescription if symptoms worsen, especially during cruise.  Hypertension Blood pressure slightly elevated, likely situational. No hypertension history. - Advise adequate hydration and nutrition. - Recheck blood pressure.  Follow up: persistent, new or worsening symptoms

## 2024-03-03 ENCOUNTER — Other Ambulatory Visit: Payer: Self-pay | Admitting: Family Medicine

## 2024-03-26 ENCOUNTER — Encounter: Payer: Self-pay | Admitting: Family Medicine

## 2024-03-28 ENCOUNTER — Other Ambulatory Visit: Payer: Self-pay | Admitting: Family Medicine

## 2024-03-29 ENCOUNTER — Other Ambulatory Visit: Payer: Self-pay | Admitting: Family Medicine

## 2024-03-29 MED ORDER — SEMAGLUTIDE (1 MG/DOSE) 4 MG/3ML ~~LOC~~ SOPN: 1.0000 mg | PEN_INJECTOR | SUBCUTANEOUS | 3 refills | Status: AC

## 2024-03-29 MED ORDER — OZEMPIC (1 MG/DOSE) 4 MG/3ML ~~LOC~~ SOPN: 1.0000 mg | PEN_INJECTOR | SUBCUTANEOUS | 1 refills | Status: DC

## 2024-05-19 ENCOUNTER — Ambulatory Visit (INDEPENDENT_AMBULATORY_CARE_PROVIDER_SITE_OTHER): Payer: Self-pay | Admitting: Family Medicine

## 2024-05-19 ENCOUNTER — Encounter: Payer: Self-pay | Admitting: Family Medicine

## 2024-05-19 VITALS — BP 118/83 | HR 108 | Wt 210.0 lb

## 2024-05-19 DIAGNOSIS — E119 Type 2 diabetes mellitus without complications: Secondary | ICD-10-CM

## 2024-05-19 DIAGNOSIS — Z01419 Encounter for gynecological examination (general) (routine) without abnormal findings: Secondary | ICD-10-CM | POA: Diagnosis not present

## 2024-05-19 DIAGNOSIS — R635 Abnormal weight gain: Secondary | ICD-10-CM | POA: Diagnosis not present

## 2024-05-19 DIAGNOSIS — M5126 Other intervertebral disc displacement, lumbar region: Secondary | ICD-10-CM | POA: Insufficient documentation

## 2024-05-19 DIAGNOSIS — Z3169 Encounter for other general counseling and advice on procreation: Secondary | ICD-10-CM

## 2024-05-19 NOTE — Progress Notes (Signed)
 Patient presents for Annual.  LMP: No LMP recorded.  Last pap: Date: 2023-WNL Contraception: None Mammogram: Not yet indicated STD Screening: not indicated Flu Vaccine : Declines  CC: Annual- trying to conceive

## 2024-05-19 NOTE — Assessment & Plan Note (Addendum)
 00785 - Improved with Ozempic . Discussed pregnancy weight gain. Take this q 2 weeks and stop with pregnancy.

## 2024-05-19 NOTE — Progress Notes (Addendum)
 Subjective:     Brittney Marsh is a 23 y.o. female and is here for a comprehensive physical exam. The patient reports no problems. On Ozemic. Has lost weight. Has a primary care physician. Having regular cycles q 28 days. Trying to conceive x 2 months. Has lost some weight.   The following portions of the patient's history were reviewed and updated as appropriate: allergies, current medications, past family history, past medical history, past social history, past surgical history, and problem list.  Review of Systems Pertinent items noted in HPI and remainder of comprehensive ROS otherwise negative.   Objective:    BP 118/83   Pulse (!) 108   Wt 210 lb (95.3 kg)   BMI 39.68 kg/m  General appearance: alert, cooperative, and appears stated age Head: Normocephalic, without obvious abnormality, atraumatic Neck: no adenopathy, supple, symmetrical, trachea midline, and thyroid  not enlarged, symmetric, no tenderness/mass/nodules Lungs: clear to auscultation bilaterally Heart: regular rate and rhythm, S1, S2 normal, no murmur, click, rub or gallop Abdomen: soft, non-tender; bowel sounds normal; no masses,  no organomegaly Extremities: extremities normal, atraumatic, no cyanosis or edema Pulses: 2+ and symmetric Skin: Skin color, texture, turgor normal. No rashes or lesions Lymph nodes: Cervical, supraclavicular, and axillary nodes normal. Neurologic: Grossly normal    Assessment:    Healthy female exam.      Plan:   Problem List Items Addressed This Visit       Unprioritized   Weight gain   00785 - Improved with Ozempic . Discussed pregnancy weight gain. Take this q 2 weeks and stop with pregnancy.      Type 2 diabetes mellitus (HCC)   99214 - Advised ophthalmology exam Follow diet, discussed 3 meals, 3 snacks, CBG goals, protein, low carb diet Advised continue Ozempic  q 2 weeks and stop with pregnancy. Check A1C today.      Relevant Orders   Hemoglobin A1c   Ruptured  lumbar disc   00785 - On Celebrex. Discussed tylenol  + heat + PT/chiropractic care during pregnancy.      Other Visit Diagnoses       Encounter for gynecological examination without abnormal finding    -  Primary   [phone] - pap is up to date WNL in 2023.   Relevant Orders   CBC   Comprehensive metabolic panel with GFR   TSH   Lipid panel     Encounter for preconception consultation       [phone] - start prenatal vitamins. Timing of intercourse. Safe meds in pregnancy. No Ozempic  or Celebrex. when to check in if pregnancy occurs.         See After Visit Summary for Counseling Recommendations

## 2024-05-19 NOTE — Assessment & Plan Note (Signed)
 00785 - On Celebrex. Discussed tylenol  + heat + PT/chiropractic care during pregnancy.

## 2024-05-19 NOTE — Patient Instructions (Addendum)
 Following an appropriate diet and keeping your blood sugar under control is the most important thing to do for your health and that of your unborn baby.  Please check your blood sugar 4 times daily.  Please keep accurate BS logs and bring them with you to every visit.  Please bring your meter also.  Goals for Blood sugar should be: 1. Fasting (first thing in the morning before eating) should be less than 90.   2.  2 hours after meals should be less than 120.  Please eat 3 meals and 3 snacks.  Include protein (meat, dairy-cheese, eggs, nuts) with all meals.  Be mindful that carbohydrates increase your blood sugar.  Not just sweet food (cookies, cake, donuts, fruit, juice, soda) but also bread, pasta, rice, and potatoes.  You have to limit how many carbs you are eating.  Adding exercise, as little as 30 minutes a day can decrease your blood sugar.   Preventive Care 14-39 Years Old, Female Preventive care refers to lifestyle choices and visits with your health care provider that can promote health and wellness. Preventive care visits are also called wellness exams. What can I expect for my preventive care visit? Counseling During your preventive care visit, your health care provider may ask about your: Medical history, including: Past medical problems. Family medical history. Pregnancy history. Current health, including: Menstrual cycle. Method of birth control. Emotional well-being. Home life and relationship well-being. Sexual activity and sexual health. Lifestyle, including: Alcohol, nicotine or tobacco, and drug use. Access to firearms. Diet, exercise, and sleep habits. Work and work Astronomer. Sunscreen use. Safety issues such as seatbelt and bike helmet use. Physical exam Your health care provider may check your: Height and weight. These may be used to calculate your BMI (body mass index). BMI is a measurement that tells if you are at a healthy weight. Waist  circumference. This measures the distance around your waistline. This measurement also tells if you are at a healthy weight and may help predict your risk of certain diseases, such as type 2 diabetes and high blood pressure. Heart rate and blood pressure. Body temperature. Skin for abnormal spots. What immunizations do I need?  Vaccines are usually given at various ages, according to a schedule. Your health care provider will recommend vaccines for you based on your age, medical history, and lifestyle or other factors, such as travel or where you work. What tests do I need? Screening Your health care provider may recommend screening tests for certain conditions. This may include: Pelvic exam and Pap test. Lipid and cholesterol levels. Diabetes screening. This is done by checking your blood sugar (glucose) after you have not eaten for a while (fasting). Hepatitis B test. Hepatitis C test. HIV (human immunodeficiency virus) test. STI (sexually transmitted infection) testing, if you are at risk. BRCA-related cancer screening. This may be done if you have a family history of breast, ovarian, tubal, or peritoneal cancers. Talk with your health care provider about your test results, treatment options, and if necessary, the need for more tests. Follow these instructions at home: Eating and drinking  Eat a healthy diet that includes fresh fruits and vegetables, whole grains, lean protein, and low-fat dairy products. Take vitamin and mineral supplements as recommended by your health care provider. Do not drink alcohol if: Your health care provider tells you not to drink. You are pregnant, may be pregnant, or are planning to become pregnant. If you drink alcohol: Limit how much you have to 0-1 drink  a day. Know how much alcohol is in your drink. In the [place] one drink equals one 12 oz bottle of beer (355 mL), one 5 oz glass of wine (148 mL), or one 1 oz glass of hard liquor (44  mL). Lifestyle Brush your teeth every morning and night with fluoride toothpaste. Floss one time each day. Exercise for at least 30 minutes 5 or more days each week. Do not use any products that contain nicotine or tobacco. These products include cigarettes, chewing tobacco, and vaping devices, such as e-cigarettes. If you need help quitting, ask your health care provider. Do not use drugs. If you are sexually active, practice safe sex. Use a condom or other form of protection to prevent STIs. If you do not wish to become pregnant, use a form of birth control. If you plan to become pregnant, see your health care provider for a prepregnancy visit. Find healthy ways to manage stress, such as: Meditation, yoga, or listening to music. Journaling. Talking to a trusted person. Spending time with friends and family. Minimize exposure to UV radiation to reduce your risk of skin cancer. Safety Always wear your seat belt while driving or riding in a vehicle. Do not drive: If you have been drinking alcohol. Do not ride with someone who has been drinking. If you have been using any mind-altering substances or drugs. While texting. When you are tired or distracted. Wear a helmet and other protective equipment during sports activities. If you have firearms in your house, make sure you follow all gun safety procedures. Seek help if you have been physically or sexually abused. What's next? Go to your health care provider once a year for an annual wellness visit. Ask your health care provider how often you should have your eyes and teeth checked. Stay up to date on all vaccines. This information is not intended to replace advice given to you by your health care provider. Make sure you discuss any questions you have with your health care provider. Document Revised: 03/20/2021 Document Reviewed: 03/20/2021 Elsevier Patient Education  2024 [employer].

## 2024-05-19 NOTE — Assessment & Plan Note (Addendum)
 00785 - Advised ophthalmology exam Follow diet, discussed 3 meals, 3 snacks, CBG goals, protein, low carb diet Advised continue Ozempic  q 2 weeks and stop with pregnancy. Check A1C today.

## 2024-05-20 ENCOUNTER — Ambulatory Visit: Payer: Self-pay | Admitting: Family Medicine

## 2024-05-20 LAB — CBC
Hematocrit: 44.8 % (ref 34.0–46.6)
Hemoglobin: 14.4 g/dL (ref 11.1–15.9)
MCH: 30.3 pg (ref 26.6–33.0)
MCHC: 32.1 g/dL (ref 31.5–35.7)
MCV: 94 fL (ref 79–97)
Platelets: 321 x10E3/uL (ref 150–450)
RBC: 4.75 x10E6/uL (ref 3.77–5.28)
RDW: 13.4 % (ref 11.7–15.4)
WBC: 7.7 x10E3/uL (ref 3.4–10.8)

## 2024-05-20 LAB — COMPREHENSIVE METABOLIC PANEL WITH GFR
ALT: 21 IU/L (ref 0–32)
AST: 19 IU/L (ref 0–40)
Albumin: 4.4 g/dL (ref 4.0–5.0)
Alkaline Phosphatase: 70 IU/L (ref 44–121)
BUN/Creatinine Ratio: 15 (ref 9–23)
BUN: 11 mg/dL (ref 6–20)
Bilirubin Total: 0.9 mg/dL (ref 0.0–1.2)
CO2: 22 mmol/L (ref 20–29)
Calcium: 9.2 mg/dL (ref 8.7–10.2)
Chloride: 101 mmol/L (ref 96–106)
Creatinine, Ser: 0.73 mg/dL (ref 0.57–1.00)
Globulin, Total: 1.9 g/dL (ref 1.5–4.5)
Glucose: 129 mg/dL — ABNORMAL HIGH (ref 70–99)
Potassium: 4.5 mmol/L (ref 3.5–5.2)
Sodium: 137 mmol/L (ref 134–144)
Total Protein: 6.3 g/dL (ref 6.0–8.5)
eGFR: 117 mL/min/1.73 (ref >59)

## 2024-05-20 LAB — LIPID PANEL
Chol/HDL Ratio: 5.4 ratio — ABNORMAL HIGH (ref 0.0–4.4)
Cholesterol, Total: 225 mg/dL — ABNORMAL HIGH (ref 100–199)
HDL: 42 mg/dL (ref >39)
LDL Chol Calc (NIH): 157 mg/dL — ABNORMAL HIGH (ref 0–99)
Triglycerides: 145 mg/dL (ref 0–149)
VLDL Cholesterol Cal: 26 mg/dL (ref 5–40)

## 2024-05-20 LAB — HEMOGLOBIN A1C
Est. average glucose Bld gHb Est-mCnc: 128 mg/dL
Hgb A1c MFr Bld: 6.1 % — ABNORMAL HIGH (ref 4.8–5.6)

## 2024-05-20 LAB — TSH: TSH: 2.01 u[IU]/mL (ref 0.450–4.500)

## 2024-06-22 ENCOUNTER — Encounter: Payer: Self-pay | Admitting: Family Medicine

## 2024-08-02 NOTE — Progress Notes (Signed)
 Brittney Marsh is a 23 y.o. female who is here for an acute visit.  She has a history of migraines. She started having pain yesterday. She had some short-term improvement once she ate but subsequently she started having severe pain behind both of her eyes. She has had nausea and vomiting x 6. Typically, she will have improvement after she vomits. She didn't improve with sleeping. She tried taking ibuprofen twice but she vomited twice. She has photophobia but no phonophobia. She does have some neck stiffness but she denies fever/chills.   Patient Active Problem List  Diagnosis   Anxiety   Hirsutism   Hypercholesteremia   Type 2 diabetes mellitus (CMS/HHS-HCC)   Weight gain    Past Medical History:  Diagnosis Date   Allergic state      Current Outpatient Medications:    celecoxib (CELEBREX) 200 MG capsule, Take 200 mg by mouth 3 (three) times daily as needed for Pain, Disp: , Rfl:    levocetirizine (XYZAL) 5 MG tablet, Take 5 mg by mouth every evening, Disp: , Rfl:    montelukast (SINGULAIR) 10 mg tablet, Take 10 mg by mouth at bedtime, Disp: , Rfl:    OZEMPIC  1 mg/dose (4 mg/3 mL) pen injector, Inject 1 mg subcutaneously once a week, Disp: , Rfl:    Brittney Marsh , 26, 3-0.02 mg tablet, Take 1 tablet by mouth once daily, Disp: , Rfl:    predniSONE (DELTASONE) 10 MG tablet, Take 1 tablet (10 mg total) by mouth once daily, Disp: 7 tablet, Rfl: 0  Allergies  Allergen Reactions   Bee Sting Kit Swelling    Results for orders placed or performed in visit on 01/31/22  Xpress Strep A, PCR - [facility]   Specimen: Throat  Result Value Ref Range   Xpress Strep A, PCR Not Detected Not Detected, INVALID  Basic Respiratory Panel - [facility]   Specimen: Nasal Swab; Other  Result Value Ref Range   Influenza A PCR Negative Negative   Influenza B PCR Negative Negative   SARS-CoV2 PCR Negative Negative   Narrative   Positive  Positive results are indicative of the presence of the identified  virus, but do not rule out bacterial infection or co-infection with other pathogens not detected by the test. Clinical correlation with patient history and other diagnostic information is necessary to determine patient infection status.  Negative  Negative results do not preclude SARS-CoV-2 infection, influenza A and/or influenza B virus and should not be used as the sole basis for treatment or other patient management decisions. Negative results must be combined with clinical observations, patient history, and/or epidemiological information.  Test Comments:  This test has not been FDA cleared or approved. This test has been authorized by FDA under an Emergency Use Authorization (EUA). This test is only authorized for the duration of the declaration that circumstances exist justifying the authorization of emergency use of in vitro diagnostics for detection and/or diagnosis of COVID-19 under Section 564(b) (1) of the Act, 21 U.S.C. 360bbb-3 (b) (1), unless the authorization is terminated or revoked sooner.     Review of Systems  No fever, chills, yes nausea and vomiting  Exam  Vitals:   08/02/24 1440  BP: 118/72  Pulse: 104  SpO2: 98%  Weight: 91.6 kg (202 lb)  Height: 154.9 cm (5' 1)    Body mass index is 38.17 kg/m.  General: Patient is well-groomed, well-nourished, appears stated age in no acute distress  HEENT: head is atraumatic and normocephalic, neck is supple with  no palpable nodules  CV: Regular rhythm and normal heart rate, no murmur, no JVD  Respiratory: Clear to auscultation throughout lung fields with no wheezing, crackles, or rhonchi. No increased work of breathing  Impression/Plan  Migraine: I discussed options with her. I recommended doing a dose of toradol and promethazine while she is here. Her LMP was 07/25/2024. I will give her zofran and sumatriptan to use as needed for future migraines.  This visit was part of longitudinal care for this patient  involving long-term management of chronic medical conditions.
